# Patient Record
Sex: Male | Born: 2005 | Race: White | Hispanic: No | Marital: Single
Health system: Southern US, Community
[De-identification: ages and names within clinical notes are randomized; demographics above are authoritative.]

---
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
## 2023-05-12 ENCOUNTER — Ambulatory Visit
Admission: EM | Admit: 2023-05-12 | Discharge: 2023-05-12 | Discharge status: Against Medical Advice | Payer: Medicaid Other
